# Patient Record
Sex: Female | Born: 2000 | Hispanic: No | Marital: Single | Smoking: Never smoker
Health system: Southern US, Community
[De-identification: ages and names within clinical notes are randomized; demographics above are authoritative.]

---
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
## 2022-08-12 IMAGING — CT CT ABD-PELV W/ CM
2 of 4 series · 16 of 46 positions shown, 18 images · IV contrast (Omnipaque)
Comparison: None.

CLINICAL DATA: 20-year-old female with right lower quadrant
abdominal pain since 1544 hours.

EXAM:
CT ABDOMEN AND PELVIS WITH CONTRAST
TECHNIQUE: Multidetector CT imaging of the abdomen and pelvis was performed
using the standard protocol following bolus administration of
intravenous contrast.

[Series 2: axial st · axial · 0.96mm/px · z∈[+657,+1102]mm · 13 of 99 slices shown, 15 images]
[im 5/99  soft-tissue]
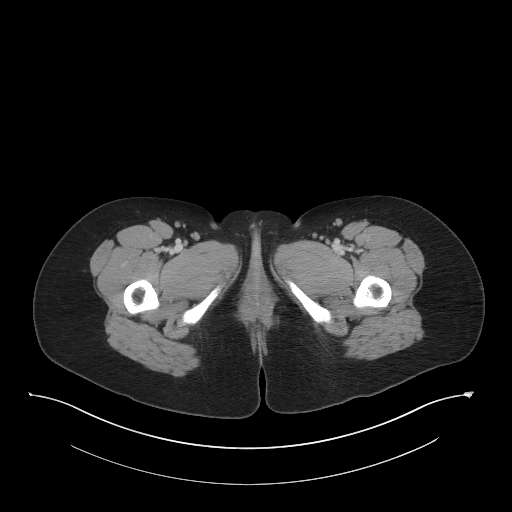
[im 5/99  bone]
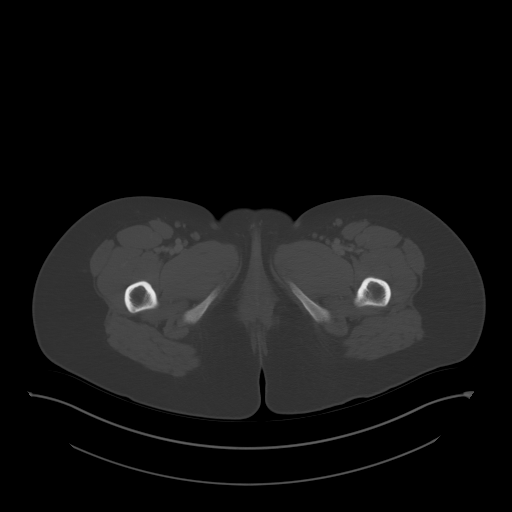
[im 15/99  soft-tissue]
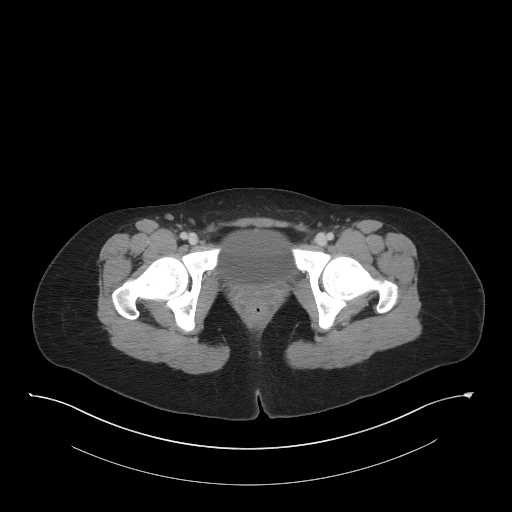
[im 19/99  soft-tissue]
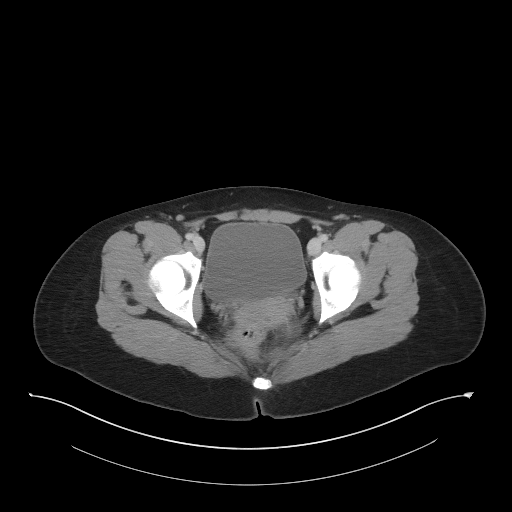
[im 29/99  soft-tissue]
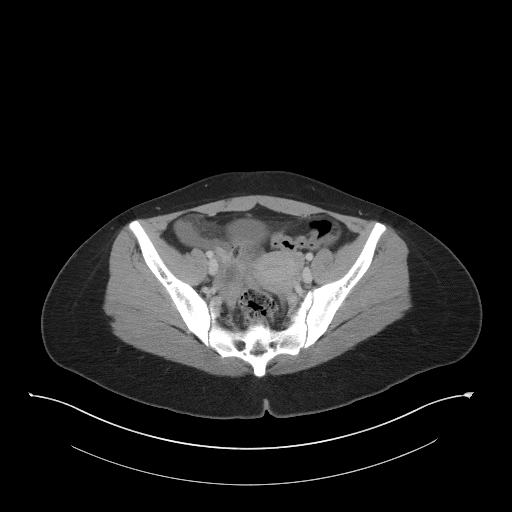
[im 33/99  soft-tissue]
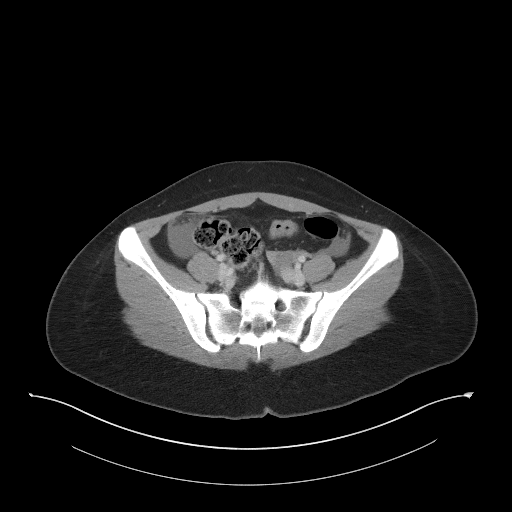
[im 43/99  soft-tissue]
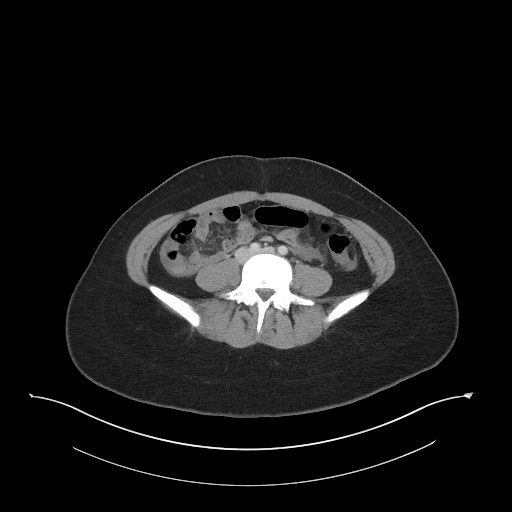
[im 52/99  soft-tissue]
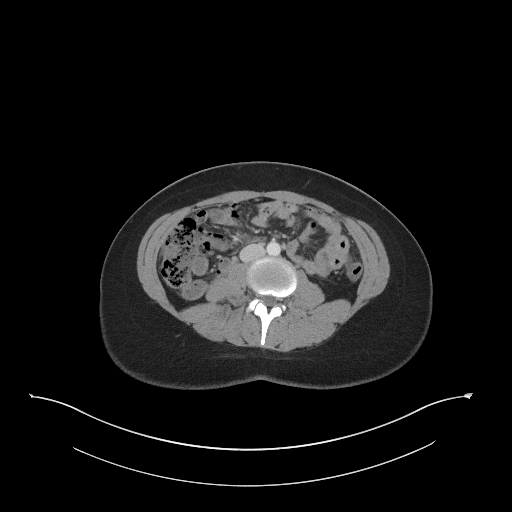
[im 57/99  soft-tissue]
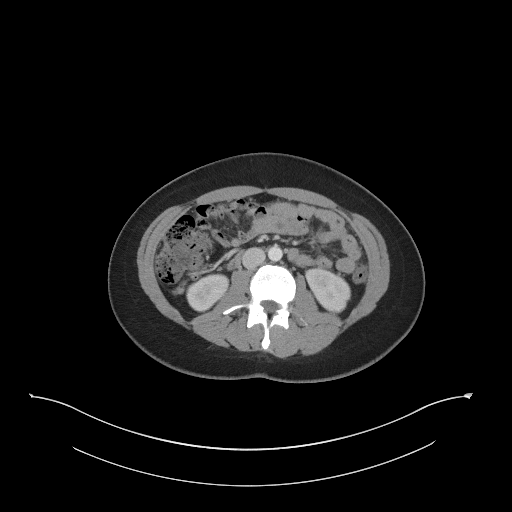
[im 66/99  soft-tissue]
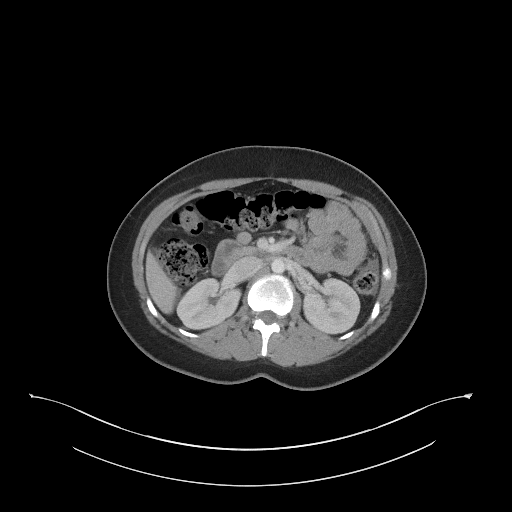
[im 66/99  bone]
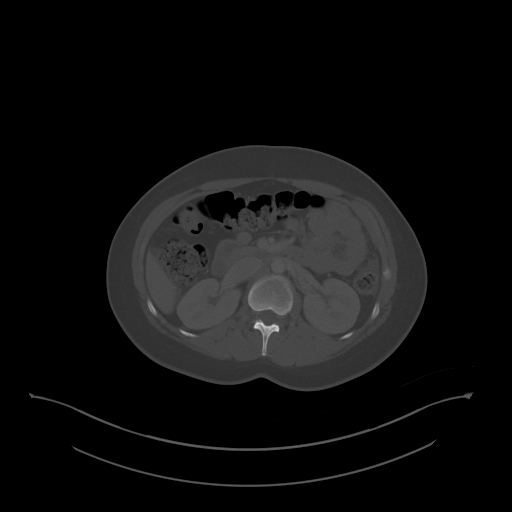
[im 71/99  soft-tissue]
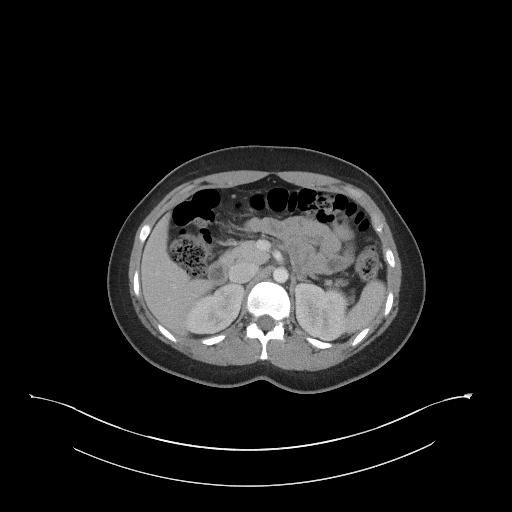
[im 80/99  soft-tissue]
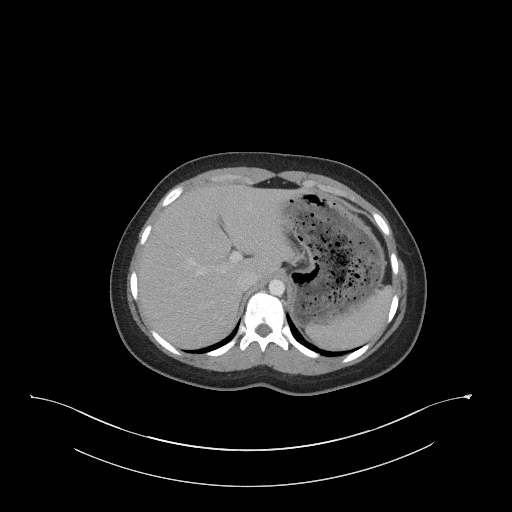
[im 85/99  soft-tissue]
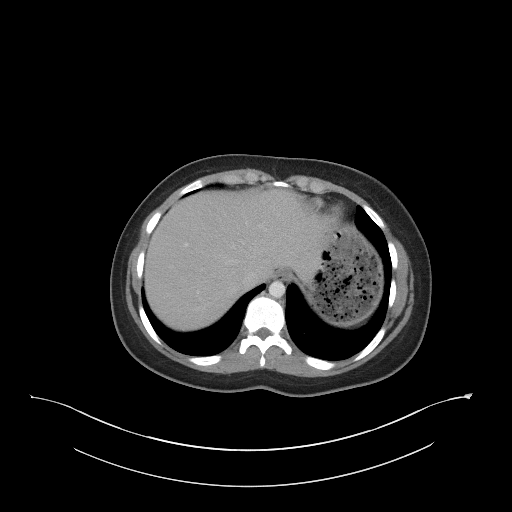
[im 94/99  soft-tissue]
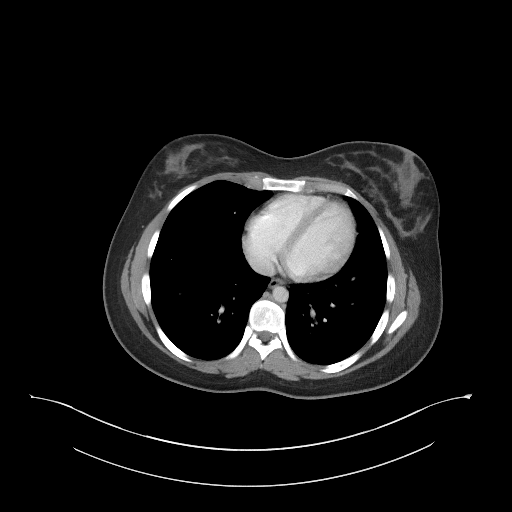

[Series 5: coronal st · coronal · 0.86mm/px · 3 of 104 slices shown]
[im 35/104  soft-tissue]
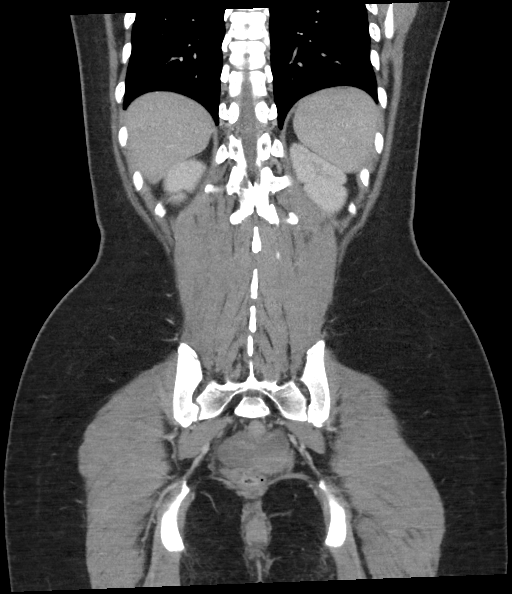
[im 46/104  soft-tissue]
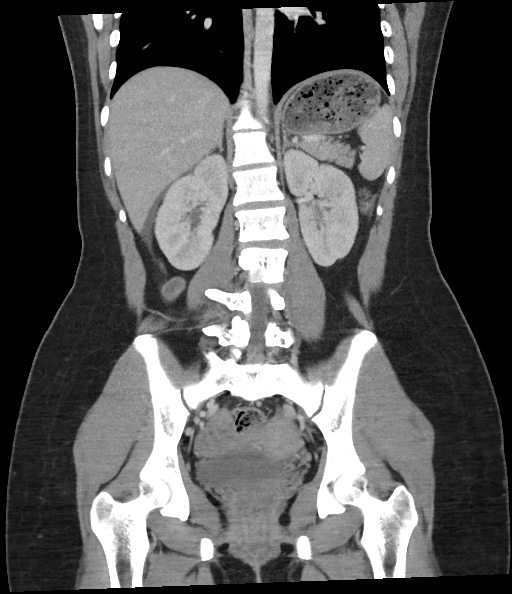
[im 58/104  soft-tissue]
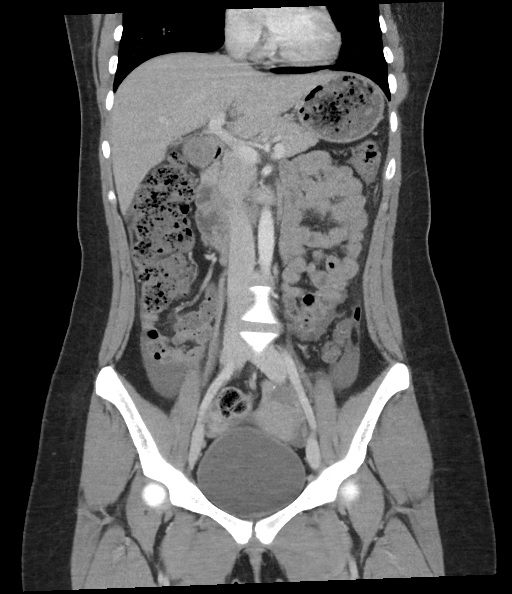

[16 of 46 positions shown; findings below may reference images not displayed]

RADIATION DOSE REDUCTION: This exam was performed according to the
departmental dose-optimization program which includes automated
exposure control, adjustment of the mA and/or kV according to
patient size and/or use of iterative reconstruction technique.

CONTRAST:  100mL OMNIPAQUE IOHEXOL 300 MG/ML  SOLN
FINDINGS: Lower chest: Negative.

Hepatobiliary: Contracted gallbladder. Trace free fluid along the
inferior liver contour, otherwise normal liver.

Pancreas: Negative.

Spleen: Negative.

Adrenals/Urinary Tract: Normal adrenal glands. Symmetric, normal
kidneys. No hydroureter. Unremarkable bladder.

Stomach/Bowel: Decompressed rectum. Redundant sigmoid colon with
some retained stool. Decompressed descending colon. Mild gas and
retained stool in the transverse and right colon. Normal retrocecal
appendix containing gas (series 2, image 45), although adjacent
small volume of free fluid. Decompressed terminal ileum. No dilated
small bowel. No free air. Food debris in the stomach which otherwise
appears negative. Negative duodenum.

Vascular/Lymphatic: Major vascular structures in the abdomen and
pelvis appear normal. No lymphadenopathy.

Reproductive: Heterogeneously enlarged right adnexa, measuring about
4.4 cm diameter. Uterus and left ovary within normal limits.

Other: Small volume of complex fluid in the cul-de-sac compatible
with hemoperitoneum., and small to moderate volume of similar fluid
at the pelvic inlet, right greater than left paracolic gutters.

Musculoskeletal: Negative.
IMPRESSION: 1. Small to moderate volume Hemoperitoneum in the lower abdomen and
pelvis.
Heterogeneously enlarged right adnexa.
Recommend quantitative beta HCG to exclude the possibility of
ruptured ectopic pregnancy. But otherwise favor an acute ruptured
hemorrhagic cyst.

2. Normal appendix. No other acute or inflammatory process
identified in the abdomen or pelvis.
